# Patient Record
Sex: Female | Born: 1975 | Race: White | Hispanic: No | Marital: Married | Smoking: Former smoker
Health system: Southern US, Community
[De-identification: ages and names within clinical notes are randomized; demographics above are authoritative.]

## PROBLEM LIST (undated history)

## (undated) DIAGNOSIS — F419 Anxiety disorder, unspecified: Secondary | ICD-10-CM

## (undated) DIAGNOSIS — F32A Depression, unspecified: Secondary | ICD-10-CM

---

## 2013-08-06 ENCOUNTER — Emergency Department: Payer: Self-pay | Admitting: Emergency Medicine

## 2013-08-26 ENCOUNTER — Emergency Department: Payer: Self-pay | Admitting: Emergency Medicine

## 2013-08-26 LAB — BASIC METABOLIC PANEL
Anion Gap: 4 — ABNORMAL LOW (ref 7–16)
BUN: 15 mg/dL (ref 7–18)
Calcium, Total: 8.4 mg/dL — ABNORMAL LOW (ref 8.5–10.1)
Co2: 26 mmol/L (ref 21–32)
Creatinine: 0.64 mg/dL (ref 0.60–1.30)
Glucose: 117 mg/dL — ABNORMAL HIGH (ref 65–99)
Osmolality: 276 (ref 275–301)
Potassium: 4.2 mmol/L (ref 3.5–5.1)

## 2013-08-26 LAB — CBC WITH DIFFERENTIAL/PLATELET
Basophil #: 0.1 10*3/uL (ref 0.0–0.1)
Basophil %: 1.2 %
Eosinophil #: 0.4 10*3/uL (ref 0.0–0.7)
Eosinophil %: 4.5 %
HCT: 35.5 % (ref 35.0–47.0)
HGB: 12.3 g/dL (ref 12.0–16.0)
Lymphocyte %: 28.4 %
MCH: 30.4 pg (ref 26.0–34.0)
MCHC: 34.6 g/dL (ref 32.0–36.0)
Monocyte %: 6.1 %
Platelet: 196 10*3/uL (ref 150–440)
RBC: 4.04 10*6/uL (ref 3.80–5.20)
RDW: 12.5 % (ref 11.5–14.5)

## 2013-08-26 LAB — HCG, QUANTITATIVE, PREGNANCY: Beta Hcg, Quant.: 97040 m[IU]/mL — ABNORMAL HIGH

## 2013-08-26 IMAGING — US US OB < 14 WEEKS
1 series · 14 of 28 positions shown · non-contrast
Comparison: None.

CLINICAL DATA: Pelvic pain and vaginal bleeding

EXAM:
OBSTETRIC <14 WK ULTRASOUND
TECHNIQUE: Transabdominal ultrasound was performed for evaluation of the
gestation as well as the maternal uterus and adnexal regions.

[Series 1: us ob < 14 weeks · 0.28mm/px · 14 of 38 slices shown]
[im 2/38]
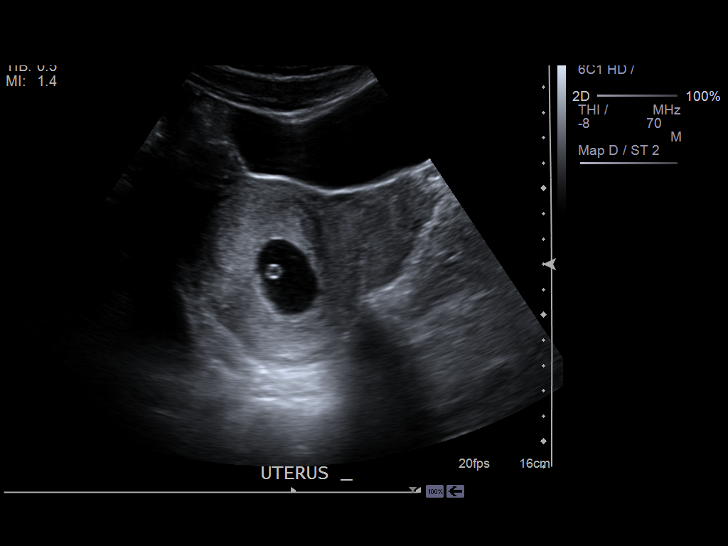
[im 5/38]
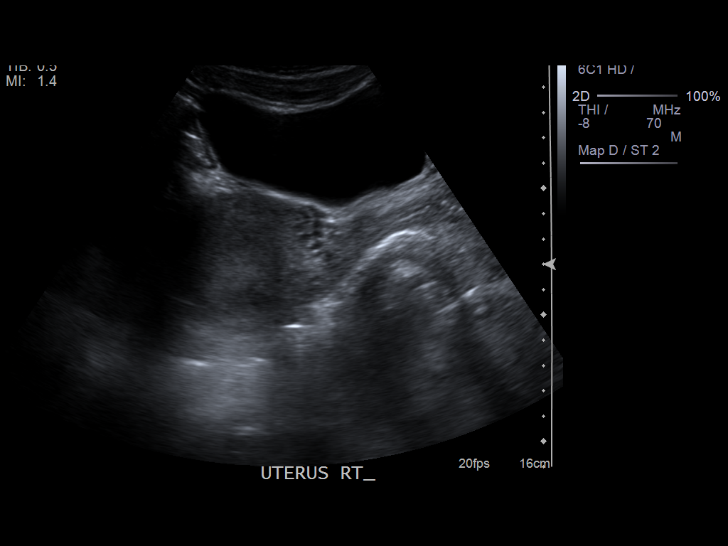
[im 7/38]
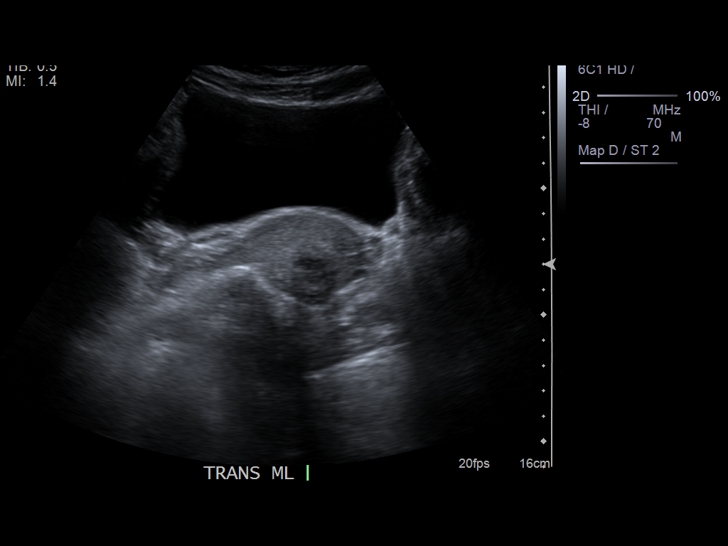
[im 10/38]
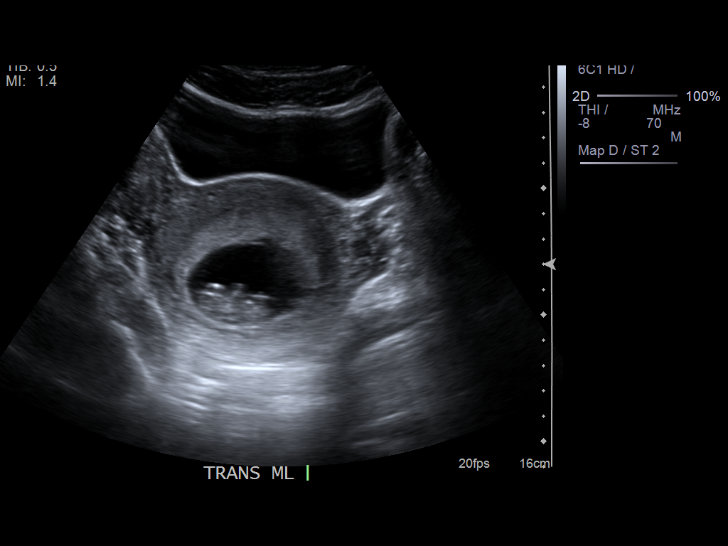
[im 13/38]
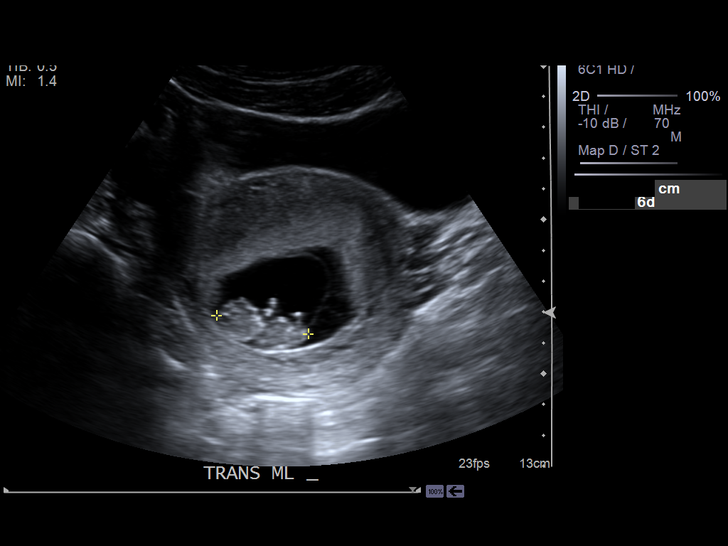
[im 16/38]
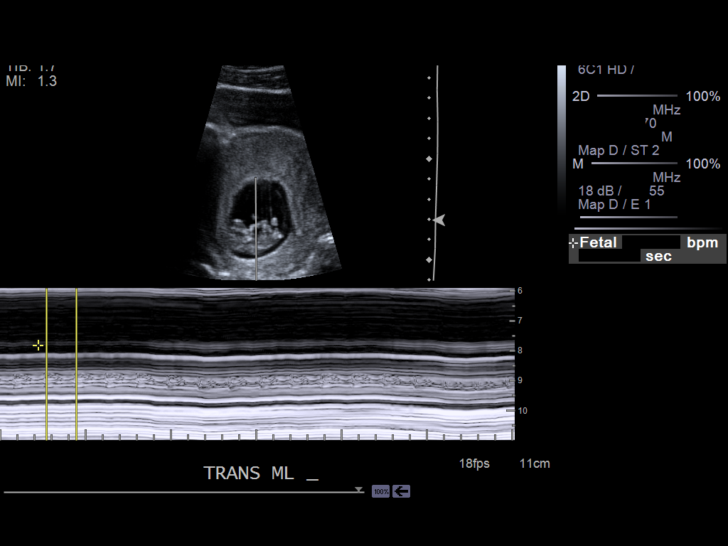
[im 18/38]
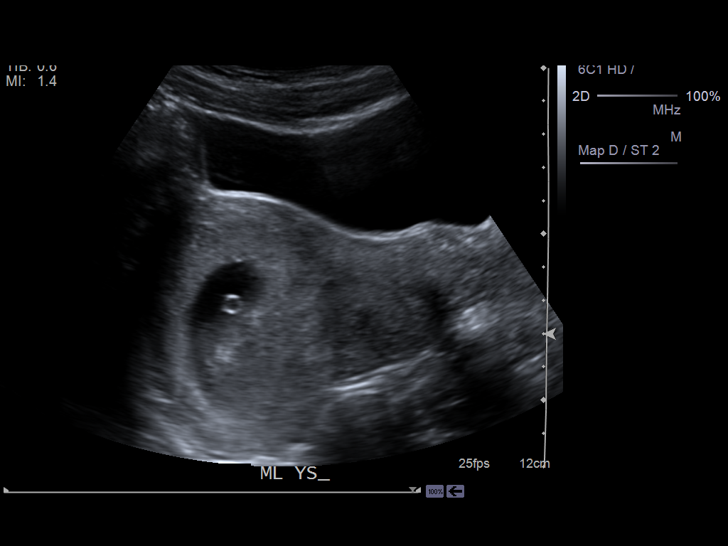
[im 21/38]
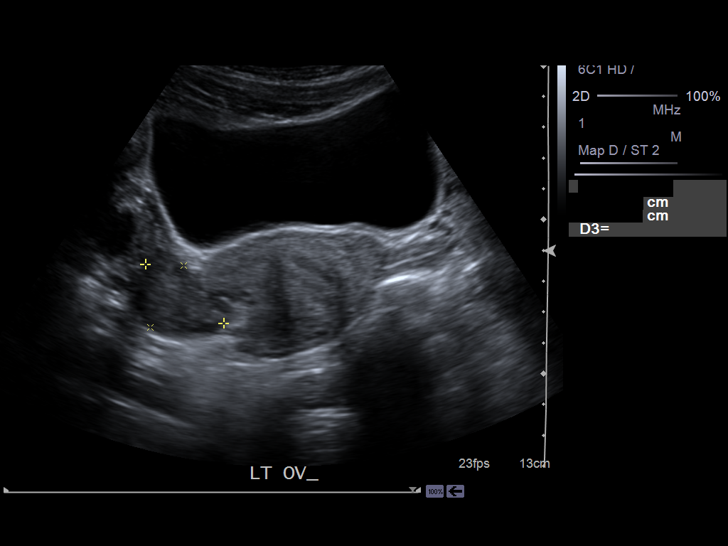
[im 24/38]
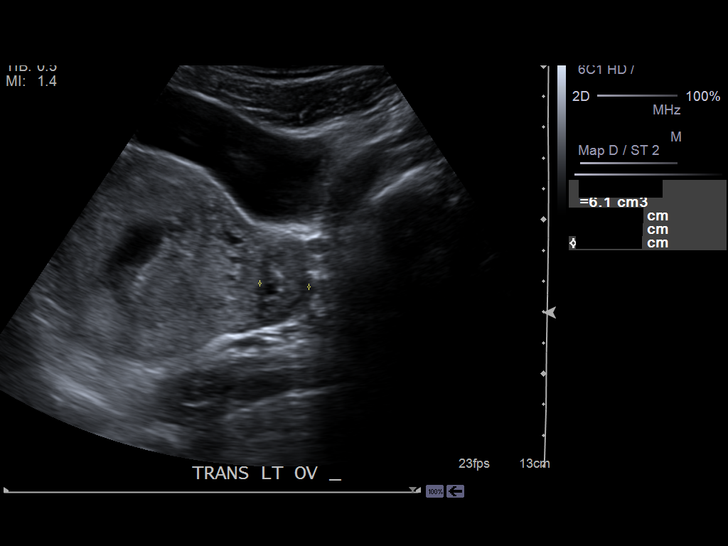
[im 27/38]
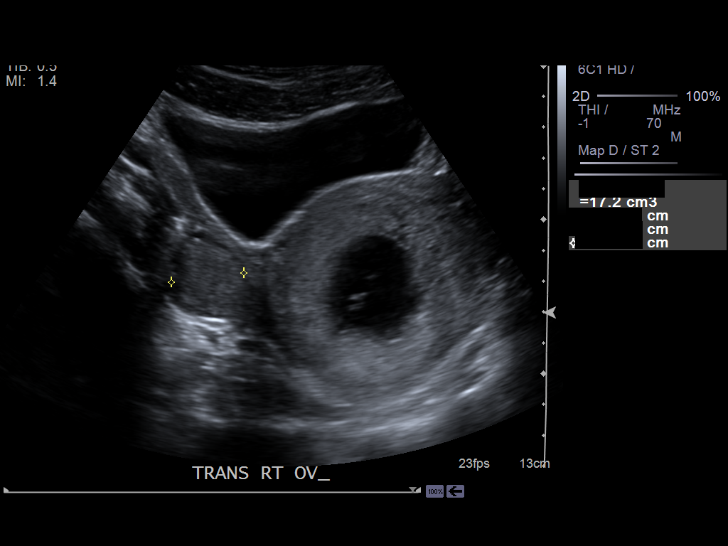
[im 29/38]
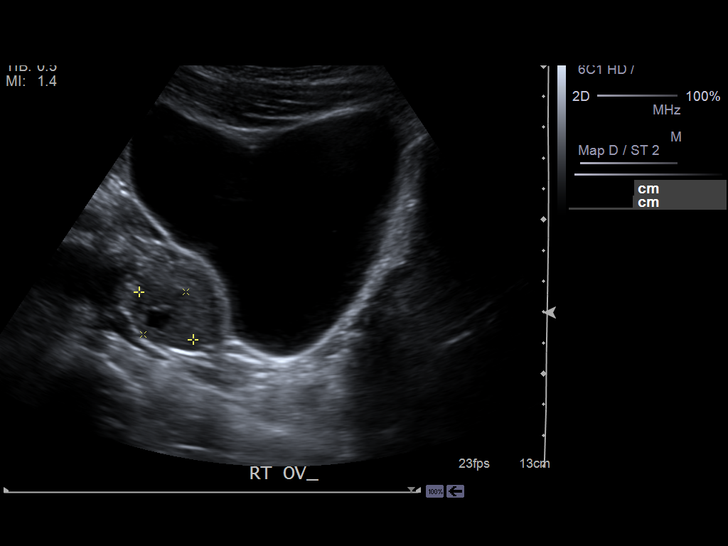
[im 32/38]
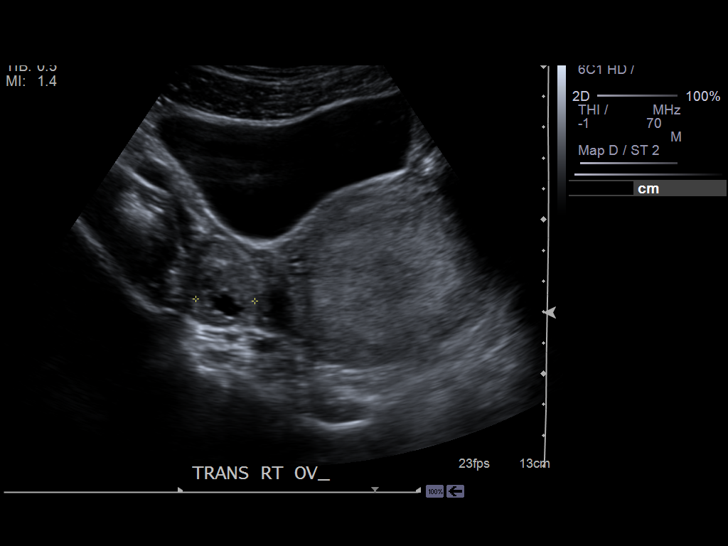
[im 35/38]
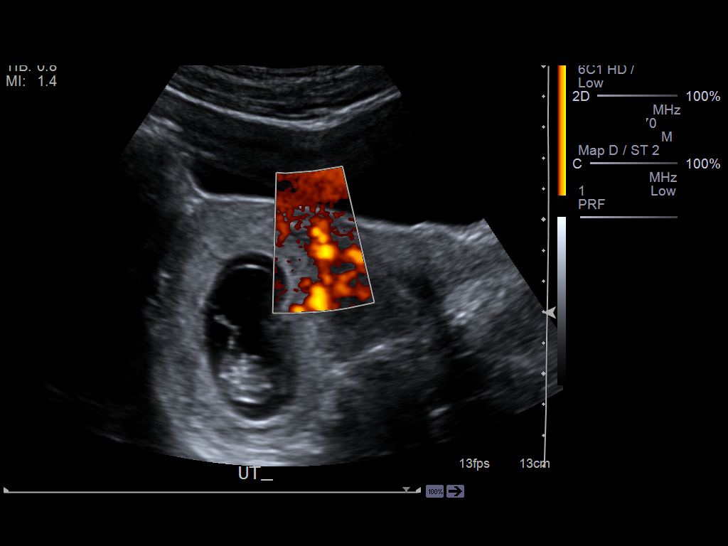
[im 38/38]
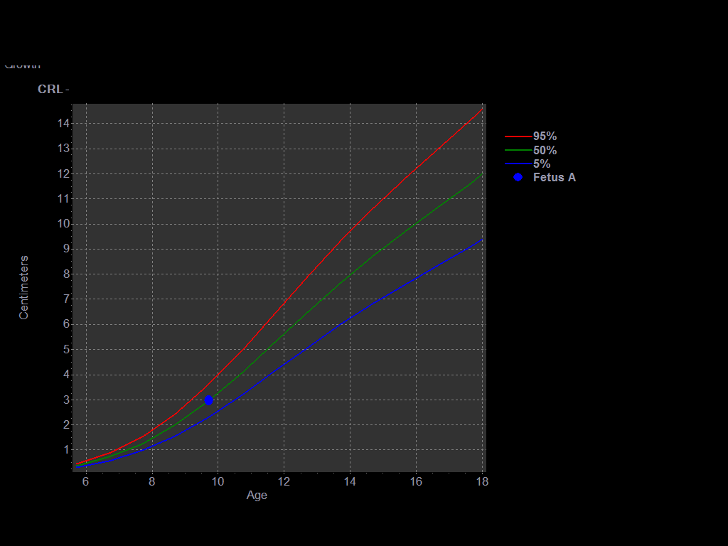

[14 of 28 positions shown; findings below may reference images not displayed]

FINDINGS: Intrauterine gestational sac: Visualized/normal in shape. There is a
tiny subchorionic hemorrhage near the lower gestational sac,
measuring less than a cm in maximal span.

Yolk sac:  Present

Embryo:  Present

Cardiac Activity: Present

Heart Rate: 171 bpm

CRL:   29.7  mm   9 w 6 d                  US EDC: [DATE]

Maternal uterus/adnexae: Other than the gestation, the uterus is
unremarkable. The ovaries are symmetric in size and normal in
appearance. No free fluid.
IMPRESSION: Single, living intrauterine gestation, estimated age 9 weeks 6 days.
Trace subchorionic hemorrhage.

## 2015-01-15 ENCOUNTER — Ambulatory Visit
Admit: 2015-01-15 | Disposition: A | Discharge status: Home / Self Care | Payer: Self-pay | Attending: Family Medicine | Admitting: Family Medicine

## 2015-01-15 LAB — RAPID STREP-A WITH REFLX: MICRO TEXT REPORT: NEGATIVE

## 2015-01-17 LAB — BETA STREP CULTURE(ARMC)

## 2021-02-16 ENCOUNTER — Inpatient Hospital Stay
Admission: RE | Admit: 2021-02-16 | Discharge: 2021-02-16 | Disposition: A | Discharge status: Home / Self Care | Payer: Self-pay | Source: Ambulatory Visit | Attending: *Deleted | Admitting: *Deleted

## 2021-02-16 ENCOUNTER — Other Ambulatory Visit: Payer: Self-pay | Admitting: *Deleted

## 2021-02-16 DIAGNOSIS — Z1231 Encounter for screening mammogram for malignant neoplasm of breast: Secondary | ICD-10-CM

## 2021-02-23 ENCOUNTER — Other Ambulatory Visit: Payer: Self-pay | Admitting: Gerontology

## 2021-02-23 DIAGNOSIS — N6320 Unspecified lump in the left breast, unspecified quadrant: Secondary | ICD-10-CM

## 2022-06-02 ENCOUNTER — Ambulatory Visit
Admission: RE | Admit: 2022-06-02 | Discharge: 2022-06-02 | Disposition: A | Discharge status: Home / Self Care | Payer: BC Managed Care – PPO | Source: Ambulatory Visit | Attending: Emergency Medicine | Admitting: Emergency Medicine

## 2022-06-02 ENCOUNTER — Other Ambulatory Visit: Payer: Self-pay

## 2022-06-02 VITALS — BP 143/113 | HR 85 | Temp 98.2°F | Resp 18 | Ht 66.0 in | Wt 176.0 lb

## 2022-06-02 DIAGNOSIS — L089 Local infection of the skin and subcutaneous tissue, unspecified: Secondary | ICD-10-CM | POA: Diagnosis not present

## 2022-06-02 DIAGNOSIS — B9689 Other specified bacterial agents as the cause of diseases classified elsewhere: Secondary | ICD-10-CM

## 2022-06-02 DIAGNOSIS — R030 Elevated blood-pressure reading, without diagnosis of hypertension: Secondary | ICD-10-CM | POA: Diagnosis not present

## 2022-06-02 HISTORY — DX: Anxiety disorder, unspecified: F41.9

## 2022-06-02 HISTORY — DX: Depression, unspecified: F32.A

## 2022-06-02 MED ORDER — CHLORHEXIDINE GLUCONATE 4 % EX LIQD: Freq: Every day | CUTANEOUS | 0 refills | Status: AC | PRN

## 2022-06-02 MED ORDER — DOXYCYCLINE HYCLATE 100 MG PO CAPS: 100.0000 mg | ORAL_CAPSULE | Freq: Two times a day (BID) | ORAL | 0 refills | Status: AC

## 2022-06-02 NOTE — ED Triage Notes (Signed)
Pt reports "I have a staph infection again."  Pt states she gets itchy and scratches her skin a lot. Right hand has several red, swollen, open and seeping areas.

## 2022-06-02 NOTE — ED Provider Notes (Signed)
HPI  SUBJECTIVE:  Melissa Stevens is a right-handed 46 y.o. female who presents with erythematous lesions with purulent drainage and odor on the dorsum of her right hand after scratching it several days ago.  She states that it is mildly painful.  She reports that her hand was swollen at the beginning of the illness, but it resolved today.  She washes her hands frequently.  No fevers, body aches, nausea, vomiting.  No eczema in this area.  No contacts with MRSA.  She tried keeping clean with soap and water, and use brave soldier cream, which is an antiseptic.  There are no aggravating or alleviating factors.  She has a past medical history of idiopathic urticaria.  No history of MRSA, eczema, diabetes.  States that she takes Allegra during the day, and Benadryl at night to help prevent her from scratching.  She was seen with similar symptoms back in June, was sent home with 10 days of Keflex and Bactrim as she had a strep pharyngitis as well.  Advised to apply Polysporin, gauze, Coban dressings.  States that she recovered well with this treatment.  LMP: 2 weeks ago.  Denies possibility of being pregnant.  [facility]  Past Medical History:  Diagnosis Date   Anxiety    Depression     History reviewed. No pertinent surgical history.  Family History  Problem Relation Age of Onset   Hypertension Mother    Hypertension Father    Lupus Father    Bipolar disorder Father     Social History   Tobacco Use   Smoking status: Former    Types: Cigarettes   Smokeless tobacco: Never  Vaping Use   Vaping Use: Never used  Substance Use Topics   Alcohol use: Yes    Comment: occasional   Drug use: Yes    Types: Marijuana    Comment: last use yesterday    No current facility-administered medications for this encounter.  Current Outpatient Medications:    chlorhexidine (HIBICLENS) 4 % external liquid, Apply topically daily as needed. Dilute 10-15 mL in water, Use daily when  bathing for 1-2 weeks, Disp: 120 mL, Rfl: 0   doxycycline (VIBRAMYCIN) 100 MG capsule, Take 1 capsule (100 mg total) by mouth 2 (two) times daily for 7 days., Disp: 14 capsule, Rfl: 0   venlafaxine XR (EFFEXOR-XR) 150 MG 24 hr capsule, , Disp: , Rfl:    busPIRone (BUSPAR) 5 MG tablet, Take 5 mg by mouth 2 (two) times daily., Disp: , Rfl:    CRYSELLE-28 0.3-30 MG-MCG tablet, Take 1 tablet by mouth daily., Disp: , Rfl:    diphenhydrAMINE (BENADRYL) 25 mg capsule, Take by mouth., Disp: , Rfl:    fexofenadine (ALLEGRA) 180 MG tablet, Take by mouth., Disp: , Rfl:    Multiple Vitamin (MULTI-VITAMIN) tablet, Take 1 tablet by mouth every morning., Disp: , Rfl:    venlafaxine XR (EFFEXOR-XR) 75 MG 24 hr capsule, Take 75 mg by mouth daily., Disp: , Rfl:   No Known Allergies   ROS  As noted in HPI.   Physical Exam  BP (!) 143/113 (BP Location: Left Arm)   Pulse 85   Temp 98.2 F (36.8 C) (Oral)   Resp 18   Ht 5\' 6"  (1.676 m)   Wt 79.8 kg   LMP 05/19/2022   SpO2 100%   BMI 28.41 kg/m   Constitutional: Well developed, well nourished, no acute distress Eyes:  EOMI, conjunctiva normal bilaterally HENT: Normocephalic, atraumatic,mucus membranes  moist Respiratory: Normal inspiratory effort Cardiovascular: Normal rate GI: nondistended skin: Nontender erythematous papules, pustules dorsum right hand.   Musculoskeletal: no deformities Neurologic: Alert & oriented x 3, no focal neuro deficits Psychiatric: Speech and behavior appropriate   ED Course   Medications - No data to display  No orders of the defined types were placed in this encounter.   No results found for this or any previous visit (from the past 24 hour(s)). No results found.  ED Clinical Impression  1. Skin infection, bacterial   2. Elevated blood pressure reading without diagnosis of hypertension      ED Assessment/Plan  Outside records reviewed.  As noted in HPI.  Suspect recurrent staph infection/infected  excoriations.  Will send home with doxycycline for 7 days.  Hibiclens, antibiotic ointment as needed.  Keep covered during the day, let air at night.  Follow-up with PCP or may return here if not improving, we can start her on Keflex.  Blood pressure noted.  It was elevated on her last urgent care visit.  She is asymptomatic.  Patient to keep an eye on this.  Discussed MDM, treatment plan, and plan for follow-up with patient. patient agrees with plan.   Meds ordered this encounter  Medications   doxycycline (VIBRAMYCIN) 100 MG capsule    Sig: Take 1 capsule (100 mg total) by mouth 2 (two) times daily for 7 days.    Dispense:  14 capsule    Refill:  0   chlorhexidine (HIBICLENS) 4 % external liquid    Sig: Apply topically daily as needed. Dilute 10-15 mL in water, Use daily when bathing for 1-2 weeks    Dispense:  120 mL    Refill:  0      *This clinic note was created using Scientist, clinical (histocompatibility and immunogenetics). Therefore, there may be occasional mistakes despite careful proofreading.  ?    Domenick Gong, MD 06/02/22 [phone]

## 2022-06-02 NOTE — Discharge Instructions (Addendum)
Finish doxycycline, even if you feel better.  Use the Hibiclens to keep it clean.  This is a surgical soap.  You can also use antibiotic ointment and keep it covered during the day while you are out.  Let it have dry time when you are at home and at night.  Follow-up with your primary care provider here if you are not getting better in several days, and we can consider adding Keflex.  Your blood pressure was also elevated here today.  Keep an eye on this.  Decrease your salt intake. diet and exercise will lower your blood pressure significantly. It is important to keep your blood pressure under good control, as having a elevated blood pressure for prolonged periods of time significantly increases your risk of stroke, heart attacks, kidney damage, eye damage, and other problems. Measure your blood pressure once a day, preferably at the same time every day. Keep a log of this and bring it to your next doctor's appointment.  Bring your blood pressure cuff as well.   Return immediately to the ER if you start having chest pain, headache, problems seeing, problems talking, problems walking, if you feel like you're about to pass out, if you do pass out, if you have a seizure, or for any other concerns.  Go to www.goodrx.com  or www.costplusdrugs.com to look up your medications. This will give you a list of where you can find your prescriptions at the most affordable prices. Or ask the pharmacist what the cash price is, or if they have any other discount programs available to help make your medication more affordable. This can be less expensive than what you would pay with insurance.

## 2022-06-15 ENCOUNTER — Encounter: Payer: Self-pay | Admitting: Advanced Practice Midwife

## 2022-06-15 ENCOUNTER — Ambulatory Visit (LOCAL_COMMUNITY_HEALTH_CENTER): Payer: BC Managed Care – PPO | Admitting: Advanced Practice Midwife

## 2022-06-15 ENCOUNTER — Ambulatory Visit: Payer: Self-pay

## 2022-06-15 VITALS — BP 130/83 | HR 88 | Temp 97.0°F | Resp 16 | Ht 65.5 in | Wt 175.0 lb

## 2022-06-15 DIAGNOSIS — F1290 Cannabis use, unspecified, uncomplicated: Secondary | ICD-10-CM | POA: Insufficient documentation

## 2022-06-15 DIAGNOSIS — Z3009 Encounter for other general counseling and advice on contraception: Secondary | ICD-10-CM

## 2022-06-15 DIAGNOSIS — Z30018 Encounter for initial prescription of other contraceptives: Secondary | ICD-10-CM | POA: Diagnosis not present

## 2022-06-15 MED ORDER — PARAGARD INTRAUTERINE COPPER IU IUD
1.0000 | INTRAUTERINE_SYSTEM | Freq: Once | INTRAUTERINE | Status: AC
  Administered 2022-06-15: 1 via INTRAUTERINE

## 2022-06-15 NOTE — Progress Notes (Signed)
[facility] [address] Main Number: [phone]  Contraception/Family Planning VISIT ENCOUNTER NOTE  Subjective:   Melissa Stevens is a 46 y.o. MWF exsmoker [id number] (11, [birth year]) female here for reproductive life counseling. The patient is currently using Oral Contraceptive to prevent pregnancy.  Here for Paraguard insertion. LMP today. Last sex 06/09/22 without condom; with current partner x 13 years. Took last ocp active pill 06/11/22 and is on placebo. Last cig 2000. Last MJ yesterday. Last ETOH 06/13/22 (1 beer) 3x/wk. Employed 40+ hrs/wk and living with husband and her 2 girls.   The patient does not want a pregnancy in the next year.    Client states they are looking for the following:  Other helps with perimenopausal hot flashes  Denies abnormal vaginal bleeding, discharge, pelvic pain, problems with intercourse or other gynecologic concerns.    Gynecologic History Patient's last menstrual period was 06/15/2022.  Health Maintenance Due  Topic Date Due   COVID-19 Vaccine (1) Never done   HIV Screening  Never done   Hepatitis C Screening  Never done   TETANUS/TDAP  Never done   PAP SMEAR-Modifier  Never done   INFLUENZA VACCINE  05/04/2022     The following portions of the patient's history were reviewed and updated as appropriate: allergies, current medications, past family history, past medical history, past social history, past surgical history and problem list.  Review of Systems Pertinent items are noted in HPI.   Objective:  BP 130/83   Pulse 88   Temp (!) 97 F (36.1 C) (Oral)   Resp 16   Ht 5' 5.5" (1.664 m)   Wt 175 lb (79.4 kg)   LMP 06/15/2022   BMI 28.68 kg/m  Gen: well appearing, NAD HEENT: no scleral icterus CV: RR Lung: Normal WOB Ext: warm well perfused  PELVIC: Normal appearing external genitalia; normal appearing vaginal mucosa and cervix.  No abnormal discharge noted.  Pap smear not  obtained.  Normal uterine size, no other palpable masses, no uterine or adnexal tenderness.   Assessment and Plan:   Need for emergency contraceptive care was assessed today.  Last unprotected sex was:  06/09/22 without condom; with current partner x 13 years; 1 partner in last 3 mo.  Patient reported > 120 hours .  Reviewed options and patient desired Cu-IUD   Contraception counseling: Reviewed methods in a patient centered fashion and used shared decision making with the patient. Utilized [employer]stream patient education tools as appropriate. The patient stated there goals and desires from a method are: Other perimenopausal relief from hot flashes  We reviewed the following methods in detail based on patient preferences available included: IUD or IUS  Patient expressed they would like IUD or IUS  This was provided to the patient today.  if not why not clearly documented  Risks, benefits, and typical effectiveness rates were reviewed.  Questions were answered.  Written information was also given to the patient to review.       will follow up in  1 years for pap.   was told to call with any further questions, or with any concerns about this method of contraception or cycle control.  Emphasized use of condoms 100% of the time for STI prevention.   1. Family planning Needs cotest 12/2022 and physical 02/2023  2. Encounter for initial prescription of other contraceptives  Paraguard insertion  Patient presented to [facility] for IUD insertion. Her GC/CT screening was found to be  up to date and using WHO criteria we can be reasonably certain she is not pregnant or a pregnancy test was obtained which was Urine pregnancy test  today was N/A.  See Flowsheet for IUD check list  IUD Insertion Procedure Note Patient identified, informed consent performed, consent signed.   Discussed risks of irregular bleeding, cramping, infection, malpositioning or misplacement of the IUD outside the uterus which may  require further procedure such as laparoscopy. Time out was performed.    Speculum placed in the vagina.  Cervix visualized.  Cleaned with Betadine x 2.  Cervix Grasped anteriorly with a single tooth tenaculum.  Uterus sounded to 7 cm.  IUD placed per manufacturer's recommendations.  Strings trimmed to 3 cm. Tenaculum was removed, good hemostasis noted.  Patient tolerated procedure well.   Patient was given post-procedure instructions- both agency handout and verbally by provider.  She was advised to have backup contraception for one week.  Patient was also asked to check IUD strings periodically or follow up in 4 weeks for IUD check.   3. Marijuana use     Please refer to After Visit Summary for other counseling recommendations.   No follow-ups on file.  Alberteen Spindle, CNM [facility] DEPARTMENT

## 2023-05-22 ENCOUNTER — Ambulatory Visit
Admission: EM | Admit: 2023-05-22 | Discharge: 2023-05-22 | Disposition: A | Discharge status: Home / Self Care | Payer: BC Managed Care – PPO | Attending: Emergency Medicine | Admitting: Emergency Medicine

## 2023-05-22 ENCOUNTER — Encounter: Payer: Self-pay | Admitting: Emergency Medicine

## 2023-05-22 DIAGNOSIS — B9689 Other specified bacterial agents as the cause of diseases classified elsewhere: Secondary | ICD-10-CM | POA: Diagnosis present

## 2023-05-22 DIAGNOSIS — N760 Acute vaginitis: Secondary | ICD-10-CM | POA: Diagnosis present

## 2023-05-22 LAB — URINALYSIS, W/ REFLEX TO CULTURE (INFECTION SUSPECTED)
Bilirubin Urine: NEGATIVE
Glucose, UA: NEGATIVE mg/dL
Hgb urine dipstick: NEGATIVE
Ketones, ur: NEGATIVE mg/dL
Leukocytes,Ua: NEGATIVE
Nitrite: NEGATIVE
Protein, ur: NEGATIVE mg/dL
RBC / HPF: NONE SEEN RBC/hpf (ref 0–5)
Specific Gravity, Urine: 1.015 (ref 1.005–1.030)
WBC, UA: NONE SEEN WBC/hpf (ref 0–5)
pH: 7.5 (ref 5.0–8.0)

## 2023-05-22 LAB — WET PREP, GENITAL
Sperm: NONE SEEN
Trich, Wet Prep: NONE SEEN
WBC, Wet Prep HPF POC: >10 — AB (ref <10)
Yeast Wet Prep HPF POC: NONE SEEN

## 2023-05-22 MED ORDER — METRONIDAZOLE 500 MG PO TABS: 500.0000 mg | ORAL_TABLET | Freq: Two times a day (BID) | ORAL | 0 refills | Status: AC

## 2023-05-22 NOTE — ED Provider Notes (Signed)
[facility]    CSN: [id number] Arrival date & time: 05/22/23  6295      History   Chief Complaint Chief Complaint  Patient presents with   Abdominal Pain   Dysuria    HPI Melissa Stevens is a 47 y.o. female.   47 year old female, Melissa Stevens, presents to ER with chief complaint of RLQ sharp abdominal pain intermittent x 2 days. Pt reports burning and urinary urgency,frequency that started this morning, abdominal pain has subsided.  The history is provided by the patient. No language interpreter was used.    Past Medical History:  Diagnosis Date   Anxiety    Depression     Patient Active Problem List   Diagnosis Date Noted   BV (bacterial vaginosis) 05/22/2023   Marijuana use 06/15/2022    History reviewed. No pertinent surgical history.  OB History     Gravida  3   Para  2   Term  2   Preterm      AB  1   Living  1      SAB      IAB      Ectopic      Multiple      Live Births  1            Home Medications    Prior to Admission medications   Medication Sig Start Date End Date Taking? Authorizing Provider  metroNIDAZOLE (FLAGYL) 500 MG tablet Take 1 tablet (500 mg total) by mouth 2 (two) times daily. 05/22/23  Yes Rylan Kaufmann, Para March, NP  venlafaxine XR (EFFEXOR-XR) 150 MG 24 hr capsule  02/13/21  Yes [provider]  venlafaxine XR (EFFEXOR-XR) 75 MG 24 hr capsule Take 75 mg by mouth daily. 03/19/22  Yes [provider]  busPIRone (BUSPAR) 5 MG tablet Take 5 mg by mouth 2 (two) times daily. 03/19/22   [provider]  chlorhexidine (HIBICLENS) 4 % external liquid Apply topically daily as needed. Dilute 10-15 mL in water, Use daily when bathing for 1-2 weeks Patient not taking: Reported on 06/15/2022 06/02/22   Domenick Gong, MD  CRYSELLE-28 0.3-30 MG-MCG tablet Take 1 tablet by mouth daily. 04/21/22   [provider]  diphenhydrAMINE (BENADRYL) 25 mg capsule Take by mouth.     [provider]  fexofenadine (ALLEGRA) 180 MG tablet Take by mouth.    [provider]  Multiple Vitamin (MULTI-VITAMIN) tablet Take 1 tablet by mouth every morning.    [provider]    Family History Family History  Problem Relation Age of Onset   Hypertension Mother    Hypertension Father    Lupus Father    Bipolar disorder Father     Social History Social History   Tobacco Use   Smoking status: Former    Types: Cigarettes   Smokeless tobacco: Never  Vaping Use   Vaping status: Never Used  Substance Use Topics   Alcohol use: Yes    Alcohol/week: 1.0 standard drink of alcohol    Types: 1 Cans of beer per week    Comment: last use 06/13/22 3x/wk   Drug use: Yes    Types: Marijuana    Comment: last use yesterday     Allergies   Patient has no known allergies.   Review of Systems Review of Systems  Constitutional:  Negative for fever.  Gastrointestinal:  Positive for abdominal pain.  Genitourinary:  Positive for dysuria, frequency and urgency.  Musculoskeletal:  Negative for back pain.  All other systems reviewed and are negative.    Physical Exam Triage Vital Signs ED Triage Vitals  Encounter Vitals Group     BP 05/22/23 0850 131/81     Systolic BP Percentile --      Diastolic BP Percentile --      Pulse Rate 05/22/23 0850 92     Resp 05/22/23 0850 14     Temp 05/22/23 0858 97.6 F (36.4 C)     Temp Source 05/22/23 0858 Temporal     SpO2 05/22/23 0850 97 %     Weight 05/22/23 0846 175 lb 0.7 oz (79.4 kg)     Height --      Head Circumference --      Peak Flow --      Pain Score 05/22/23 0846 2     Pain Loc --      Pain Education --      Exclude from Growth Chart --    No data found.  Updated Vital Signs BP 131/81 (BP Location: Right Arm)   Pulse 92   Temp 97.6 F (36.4 C) (Temporal) Comment: Patient was drinking hot coffee during triage  Resp 14   Wt 175 lb 0.7 oz (79.4 kg)   SpO2 97%   BMI 28.69 kg/m    Visual Acuity Right Eye Distance:   Left Eye Distance:   Bilateral Distance:    Right Eye Near:   Left Eye Near:    Bilateral Near:     Physical Exam Vitals and nursing note reviewed.  Abdominal:     General: Bowel sounds are normal.     Palpations: Abdomen is soft.     Tenderness: There is no abdominal tenderness. There is no guarding or rebound.      UC Treatments / Results  Labs (all labs ordered are listed, but only abnormal results are displayed) Labs Reviewed  WET PREP, GENITAL - Abnormal; Notable for the following components:      Result Value   Clue Cells Wet Prep HPF POC PRESENT (*)    WBC, Wet Prep HPF POC >10 (*)    All other components within normal limits  URINALYSIS, W/ REFLEX TO CULTURE (INFECTION SUSPECTED) - Abnormal; Notable for the following components:   Bacteria, UA RARE (*)    All other components within normal limits    EKG   Radiology No results found.  Procedures Procedures (including critical care time)  Medications Ordered in UC Medications - No data to display  Initial Impression / Assessment and Plan / UC Course  I have reviewed the triage vital signs and the nursing notes.  Pertinent labs & imaging results that were available during my care of the patient were reviewed by me and considered in my medical decision making (see chart for details).     Ddx: BV, UTI,constipation,viral illness Final Clinical Impressions(s) / UC Diagnoses   Final diagnoses:  BV (bacterial vaginosis)     Discharge Instructions      Your urine was negative for UTI, drink plenty of water.  We are treating you for bacterial vaginosis, take Flagyl as directed, avoid alcohol while taking the medication.  Follow-up with PCP.  Go to the emergency room for new or worsening issues or concerns(worsening abdominal pain, inability to keep medication down, fever,etc).     ED Prescriptions     Medication Sig Dispense Auth. Provider   metroNIDAZOLE  (FLAGYL) 500 MG tablet Take 1 tablet (500 mg  total) by mouth 2 (two) times daily. 14 tablet Syrenity Klepacki, Para March, NP      PDMP not reviewed this encounter.   Clancy Gourd, NP 05/22/23 1336

## 2023-05-22 NOTE — Discharge Instructions (Signed)
Your urine was negative for UTI, drink plenty of water.  We are treating you for bacterial vaginosis, take Flagyl as directed, avoid alcohol while taking the medication.  Follow-up with PCP.  Go to the emergency room for new or worsening issues or concerns(worsening abdominal pain, inability to keep medication down, fever,etc).

## 2023-05-22 NOTE — ED Triage Notes (Signed)
Patient reports sharp Right lower abdominal pain off and on for 2 days ago.  Patient reports burning and urinary urgency and frequency that started this morning.
